# Patient Record
Sex: Male | Born: 1955 | Race: White | Hispanic: No | Marital: Married
Health system: Southern US, Community
[De-identification: ages and names within clinical notes are randomized; demographics above are authoritative.]

---

## 2007-12-25 ENCOUNTER — Ambulatory Visit: Payer: Self-pay | Admitting: Gastroenterology

## 2007-12-25 ENCOUNTER — Other Ambulatory Visit: Payer: Self-pay

## 2008-01-19 ENCOUNTER — Other Ambulatory Visit: Payer: Self-pay

## 2008-01-19 ENCOUNTER — Ambulatory Visit: Payer: Self-pay | Admitting: Unknown Physician Specialty

## 2008-01-20 ENCOUNTER — Inpatient Hospital Stay: Payer: Self-pay | Admitting: Unknown Physician Specialty

## 2013-10-25 ENCOUNTER — Inpatient Hospital Stay: Payer: Self-pay | Admitting: Internal Medicine

## 2013-10-25 LAB — BASIC METABOLIC PANEL
ANION GAP: 10 (ref 7–16)
BUN: 15 mg/dL (ref 7–18)
CALCIUM: 8.5 mg/dL (ref 8.5–10.1)
Chloride: 97 mmol/L — ABNORMAL LOW (ref 98–107)
Co2: 25 mmol/L (ref 21–32)
Creatinine: 1.23 mg/dL (ref 0.60–1.30)
EGFR (African American): >60
Glucose: 99 mg/dL (ref 65–99)
OSMOLALITY: 265 (ref 275–301)
Potassium: 2.5 mmol/L — CL (ref 3.5–5.1)
Sodium: 132 mmol/L — ABNORMAL LOW (ref 136–145)

## 2013-10-25 LAB — HEPATIC FUNCTION PANEL A (ARMC)
ALBUMIN: 3.2 g/dL — AB (ref 3.4–5.0)
Alkaline Phosphatase: 100 U/L
BILIRUBIN TOTAL: 1 mg/dL (ref 0.2–1.0)
Bilirubin, Direct: 0.3 mg/dL — ABNORMAL HIGH (ref 0.00–0.20)
SGOT(AST): 75 U/L — ABNORMAL HIGH (ref 15–37)
SGPT (ALT): 91 U/L — ABNORMAL HIGH (ref 12–78)
Total Protein: 7 g/dL (ref 6.4–8.2)

## 2013-10-25 LAB — DRUG SCREEN, URINE

## 2013-10-25 LAB — POTASSIUM: POTASSIUM: 3.6 mmol/L (ref 3.5–5.1)

## 2013-10-25 LAB — URINALYSIS, COMPLETE
Bacteria: NONE SEEN
Bilirubin,UR: NEGATIVE
Glucose,UR: NEGATIVE mg/dL (ref 0–75)
Ketone: NEGATIVE
LEUKOCYTE ESTERASE: NEGATIVE
Nitrite: NEGATIVE
PROTEIN: NEGATIVE
Ph: 5 (ref 4.5–8.0)
SPECIFIC GRAVITY: 1.014 (ref 1.003–1.030)
SQUAMOUS EPITHELIAL: NONE SEEN
WBC UR: 7 /HPF (ref 0–5)

## 2013-10-25 LAB — PHENYTOIN LEVEL, TOTAL: Dilantin: <0.4 ug/mL — ABNORMAL LOW (ref 10.0–20.0)

## 2013-10-25 LAB — CBC
HCT: 41.3 % (ref 40.0–52.0)
HGB: 14.1 g/dL (ref 13.0–18.0)
MCH: 34.8 pg — ABNORMAL HIGH (ref 26.0–34.0)
MCHC: 34.1 g/dL (ref 32.0–36.0)
MCV: 102 fL — AB (ref 80–100)
PLATELETS: 147 10*3/uL — AB (ref 150–440)
RBC: 4.04 10*6/uL — ABNORMAL LOW (ref 4.40–5.90)
RDW: 15 % — AB (ref 11.5–14.5)
WBC: 13.9 10*3/uL — ABNORMAL HIGH (ref 3.8–10.6)

## 2013-10-25 LAB — CARBAMAZEPINE LEVEL, TOTAL: Carbamazepine: <0.5 ug/mL — ABNORMAL LOW (ref 4.0–12.0)

## 2013-10-25 LAB — PHENOBARBITAL LEVEL

## 2013-10-25 LAB — PHOSPHORUS: PHOSPHORUS: 4 mg/dL (ref 2.5–4.9)

## 2013-10-25 LAB — MAGNESIUM: Magnesium: 2.3 mg/dL

## 2013-10-25 LAB — VALPROIC ACID LEVEL: Valproic Acid: <3 ug/mL — ABNORMAL LOW

## 2013-10-26 LAB — COMPREHENSIVE METABOLIC PANEL
ALK PHOS: 100 U/L
Albumin: 2.7 g/dL — ABNORMAL LOW (ref 3.4–5.0)
Anion Gap: 6 — ABNORMAL LOW (ref 7–16)
BUN: 12 mg/dL (ref 7–18)
Bilirubin,Total: 0.7 mg/dL (ref 0.2–1.0)
CREATININE: 1.1 mg/dL (ref 0.60–1.30)
Calcium, Total: 8.2 mg/dL — ABNORMAL LOW (ref 8.5–10.1)
Chloride: 109 mmol/L — ABNORMAL HIGH (ref 98–107)
Co2: 24 mmol/L (ref 21–32)
EGFR (African American): >60
GLUCOSE: 85 mg/dL (ref 65–99)
Osmolality: 277 (ref 275–301)
Potassium: 3.2 mmol/L — ABNORMAL LOW (ref 3.5–5.1)
SGOT(AST): 58 U/L — ABNORMAL HIGH (ref 15–37)
SGPT (ALT): 64 U/L (ref 12–78)
Sodium: 139 mmol/L (ref 136–145)
Total Protein: 6.2 g/dL — ABNORMAL LOW (ref 6.4–8.2)

## 2013-10-26 LAB — CBC WITH DIFFERENTIAL/PLATELET
BASOS ABS: 0 10*3/uL (ref 0.0–0.1)
Basophil %: 0.4 %
EOS PCT: 0.5 %
Eosinophil #: 0 10*3/uL (ref 0.0–0.7)
HCT: 37.5 % — ABNORMAL LOW (ref 40.0–52.0)
HGB: 12.8 g/dL — AB (ref 13.0–18.0)
Lymphocyte #: 1.8 10*3/uL (ref 1.0–3.6)
Lymphocyte %: 25.6 %
MCH: 35.1 pg — ABNORMAL HIGH (ref 26.0–34.0)
MCHC: 34.1 g/dL (ref 32.0–36.0)
MCV: 103 fL — AB (ref 80–100)
MONO ABS: 1.3 x10 3/mm — AB (ref 0.2–1.0)
Monocyte %: 19.2 %
NEUTROS ABS: 3.7 10*3/uL (ref 1.4–6.5)
Neutrophil %: 54.3 %
PLATELETS: 111 10*3/uL — AB (ref 150–440)
RBC: 3.65 10*6/uL — ABNORMAL LOW (ref 4.40–5.90)
RDW: 15.5 % — AB (ref 11.5–14.5)
WBC: 6.9 10*3/uL (ref 3.8–10.6)

## 2013-11-25 ENCOUNTER — Ambulatory Visit: Payer: Self-pay | Admitting: Orthopedic Surgery

## 2013-11-25 LAB — BASIC METABOLIC PANEL
Anion Gap: 5 — ABNORMAL LOW (ref 7–16)
BUN: 11 mg/dL (ref 7–18)
CO2: 27 mmol/L (ref 21–32)
CREATININE: 0.87 mg/dL (ref 0.60–1.30)
Calcium, Total: 9.4 mg/dL (ref 8.5–10.1)
Chloride: 110 mmol/L — ABNORMAL HIGH (ref 98–107)
EGFR (Non-African Amer.): >60
GLUCOSE: 75 mg/dL (ref 65–99)
Osmolality: 281 (ref 275–301)
Potassium: 3.9 mmol/L (ref 3.5–5.1)
Sodium: 142 mmol/L (ref 136–145)

## 2013-11-25 LAB — APTT: ACTIVATED PTT: 33.6 s (ref 23.6–35.9)

## 2013-11-25 LAB — URINALYSIS, COMPLETE
Bacteria: NONE SEEN
Bilirubin,UR: NEGATIVE
Blood: NEGATIVE
Glucose,UR: NEGATIVE mg/dL (ref 0–75)
Ketone: NEGATIVE
Leukocyte Esterase: NEGATIVE
NITRITE: NEGATIVE
PH: 5 (ref 4.5–8.0)
Protein: NEGATIVE
RBC,UR: 1 /HPF (ref 0–5)
Specific Gravity: 1.02 (ref 1.003–1.030)
Squamous Epithelial: NONE SEEN

## 2013-11-25 LAB — CBC
HCT: 44.8 % (ref 40.0–52.0)
HGB: 15.1 g/dL (ref 13.0–18.0)
MCH: 34.3 pg — ABNORMAL HIGH (ref 26.0–34.0)
MCHC: 33.6 g/dL (ref 32.0–36.0)
MCV: 102 fL — ABNORMAL HIGH (ref 80–100)
Platelet: 187 10*3/uL (ref 150–440)
RBC: 4.4 10*6/uL (ref 4.40–5.90)
RDW: 13.5 % (ref 11.5–14.5)
WBC: 8.6 10*3/uL (ref 3.8–10.6)

## 2013-11-25 LAB — PROTIME-INR
INR: 1.1
Prothrombin Time: 13.6 secs (ref 11.5–14.7)

## 2014-01-20 ENCOUNTER — Ambulatory Visit: Payer: Self-pay | Admitting: Orthopedic Surgery

## 2014-01-20 LAB — DRUG SCREEN, URINE
AMPHETAMINES, UR SCREEN: NEGATIVE (ref ?–1000)
BARBITURATES, UR SCREEN: NEGATIVE (ref ?–200)
BENZODIAZEPINE, UR SCRN: NEGATIVE (ref ?–200)
Cannabinoid 50 Ng, Ur ~~LOC~~: POSITIVE (ref ?–50)
Cocaine Metabolite,Ur ~~LOC~~: NEGATIVE (ref ?–300)
MDMA (ECSTASY) UR SCREEN: NEGATIVE (ref ?–500)
METHADONE, UR SCREEN: NEGATIVE (ref ?–300)
OPIATE, UR SCREEN: POSITIVE (ref ?–300)
Phencyclidine (PCP) Ur S: NEGATIVE (ref ?–25)
TRICYCLIC, UR SCREEN: NEGATIVE (ref ?–1000)

## 2014-10-09 NOTE — Consult Note (Signed)
PATIENT NAME:  John Maldonado, [name] MR#:  [id number] DATE OF BIRTH:  July 28, 1955  DATE OF CONSULTATION:  10/26/2013  CONSULTING PHYSICIAN:  Kathreen DevoidKevin L. Malonie Tatum, MD  REASON FOR CONSULTATION:  1.  Right shoulder full-thickness rotator cuff tear.  2.  Right distal radius fracture.   HISTORY OF PRESENT ILLNESS: John Maldonado is a 59 year old male who has a history of heavy alcohol use. He recently tried to quit cold [place] and sustained new onset seizures. He sustained a fall during the seizure activity injuring his right shoulder. The patient explains to me today that he had a fall approximately two weeks ago and injured his right wrist. He has been self treating with an over-the-counter brace that he bought on his own. However, he has had persistent pain and swelling in the right wrist despite immobilization.   PAST MEDICAL HISTORY, PAST surgical history, family and social history as well as medicationS AND allergies: Reviewed from the patient's electronic medical record and from his admission H and P.   PHYSICAL EXAMINATION: RIGHT SHOULDER. The patient had ecchymosis throughout the right upper arm. He had point tenderness over the right shoulder with swelling in place. He had intact sensation to light touch. There was no associated erythema. He had intact sensation over the axillary nerve distribution and throughout the right upper extremity. He had point tenderness over the distal radius. He can flex and extend all five digits and had intact sensation in all five digits of his fingers, well-perfused and he had a palpable radial pulse. The patient could not actively abduct or forward elevate his arm beyond approximately 20 to 30 degrees and had significant pain with this activity.   RADIOLOGY:  Right Wrist:  X-ray films of the right wrist were reviewed from [facility]. This shows a fracture of the distal radius involving the radial styloid. There is approximately a 1 to 2 mm  step-off but no significant diastasis or radial loss of radial height. The patient has no significant angulation to the fracture on the sagittal views.   Right shoulder. MRI was reviewed today which demonstrated full-thickness rotator cuff tear involving the supra and infraspinatus. There was also partial tear of the subscapularis which has allowed subluxation medially of the biceps tendon out of the intertuberous groove. There is no fracture seen.   ASSESSMENT: 1.  Full-thickness rotator cuff tear.  2.  Right distal radius fracture.   PLAN: I explained to John Maldonado that he has a fracture of his right distal radius. This was likely two weeks old already. He is still, however, having pain and swelling. I recommended placement of a short arm cast which I performed at the bedside today. He will continue wearing this for the next 2 to 3 weeks, and follow up in my office for removal.   In addition the patient has a full-thickness rotator cuff tear which will require surgical intervention. The patient is very active, and works at a job requiring manual labor. He will require full strength and use of his right shoulder.   I will see him back in the office in 2 weeks to allow the swelling and ecchymosis to resolve. He will require surgical intervention. We will discuss surgery at his next office visit and select the date for surgery. He may use the right arm as tolerated at this time.   The patient understood and agreed with this plan.   ____________________________ Kathreen DevoidKevin L. Arden Axon, MD klk:jb D: 10/27/2013 18:23:52 ET T: 10/27/2013 18:36:35  ET JOB#: [id number]  cc: Kathreen Devoid, MD, <Dictator> Kathreen Devoid MD ELECTRONICALLY SIGNED 11/03/2013 7:49

## 2014-10-09 NOTE — Op Note (Signed)
PATIENT NAME:  John, Maldonado MR#:  [id number] DATE OF BIRTH:  October 03, 1955  DATE OF PROCEDURE:  01/20/2014  PREOPERATIVE DIAGNOSES: Right shoulder rotator cuff tear, subacromial impingement, acromioclavicular arthrosis and biceps tendon subluxation.   POSTOPERATIVE DIAGNOSES: Right rotator cuff tear involving the supraspinatus and infraspinatus and a partial tear of the subscapularis, subacromial impingement, acromioclavicular arthrosis and biceps tendon subluxation.   PROCEDURE: Right shoulder arthroscopic subacromial decompression, distal clavicle excision, and mini open biceps tenodesis, subscapularis, supraspinatus and infraspinatus rotator cuff repairs.   SURGEON: Thornton Park, MD  ANESTHESIA: General with interscalene block and local anesthetic with lidocaine and Marcaine.   ESTIMATED BLOOD LOSS: Minimal.   COMPLICATIONS: None.   IMPLANTS: ArthroCare Magnum M anchors x2 and Magnum 2 anchors x6.   INDICATION FOR THE PROCEDURE: The patient is a 59 year old male who sustained a fall in May of 2015. A MRI has confirmed a full-thickness tear of his rotator cuff involving the supra and infraspinatus with retraction to the glenohumeral joint. There was a partial tear to the subscapularis and subluxation of the biceps tendon. The patient is very active and works a Ambulance person job and has had persistent pain and weakness in the right shoulder. He wished to proceed with repair of his rotator cuff.   I reviewed the risks and benefits of surgery with the patient prior to surgery. He understands the risks include infection, bleeding, nerve or blood vessel injury which may lead to permanent numbness or weakness, shoulder stiffness, re-tear of the rotator cuff, hardware failure, persistent pain and the need for further surgery. Medical risks include but are not limited to DVT and pulmonary embolism, myocardial infarction, stroke, pneumonia, respiratory failure and death. The patient understood these  risks and wished to proceed. He signed the consent form.   DESCRIPTION OF PROCEDURE: I met with the patient in the preoperative area. I filled out his history and physical form. I marked the right shoulder with the word "yes" according to the hospital's right site protocol. I answered all of the questions by the patient. He had an interscalene block placed by the anesthesia service. He was then positioned in a beach chair position. All bony prominences were adequately padded and care was taken to align his head in the neutral position in the headholder. A spider arm positioner was used for this case.   The patient was prepped and draped in a sterile fashion. A timeout was performed to verify the patient's name, date of birth, medical record number, correct site of surgery and correct procedure to be performed. It was also used to verify the patient had received antibiotics and that all appropriate instruments, implants and radiographic studies were available in the room. Once all in attendance were in agreement, the case began.   Examination under anesthesia revealed that the patient had some stiffness. He could be forward elevated to approximately 140 degrees and in 90 degrees of abduction he can actually rotate to approximately 75 to 80 degrees and internally rotate approximately 50 degrees. He had no limitation with external/internal rotation with the arm adducted by his side. In abduction he can achieve approximately 100 to 110 degrees. There was no evidence of instability to load and shift testing.   The patient's bony landmarks were drawn out with a surgical marker along with proposed arthroscopy incisions. These were pre-injected with 1% lidocaine plain. An 11 blade was used to establish the posterior portal through which the arthroscope was placed into the glenohumeral joint. A full diagnostic examination of  the shoulder was undertaken. During the diagnostic portion of the procedure, an 18-gauge  spinal needle was used to localize placement for the anterior portal through which a 5.75 mm arthroscopic cannula was placed.   Findings on arthroscopy included subluxation of the biceps tendon medially out of the intertuberous groove and a tear involving the subscapularis, which was not retracted and involved the superior portion of the subscapularis. This was a partial tear. The patient had complete tears of the supraspinatus and infraspinatus, which were retracted to the level of the glenohumeral joint. There were no arthroscopic loose bodies in the inferior recess. The glenoid labrum was significantly frayed, but not detached from the glenoid.   A 4-0 resector shaver blade was placed through the anterior portal. It was used to debride the frayed edges of the labrum. The patient also had chondral flaps on the humeral head and glenoid. Chondroplasty of both surfaces were performed. A lateral portal was then established, again using direct visualization with an 18-gauge spinal needle. An Community education officer stitch was then placed in the biceps tendon through the lateral portal to allow for control of the tendon after tenolysis. An arthroscopic scissor was then placed through the anterior portal and the biceps tendon released. A 4-0 resector shaver blade was then placed through the anterior portal and the remaining biceps stump attached to the superior labrum, was debrided. A Smart stich was then placed in the torn portion of the subscapularis.  Two additional ArthroCare Smart stitches were then brought in through the lateral portal and placed in the lateral edge of the rotator cuff to be used as traction sutures.   The arthroscope was then placed into the subacromial space. A 90 degree ArthroCare wand and 4-0 resector shaver blade were used to perform a bursectomy. A 5.5 mm resector shaver blade was then brought in through the lateral portal to perform a subacromial decompression. The 5.5 mm resector shaver  blade was then placed through the anterior portal into the [facility] joint and a distal clavicle excision was performed.   All arthroscopic instruments were then removed after copious irrigation of the subacromial space.   The mini open portion of the procedure was then initiated by making a saber-type incision off the lateral border of the acromion. Subacromial tissues were dissected carefully with electrocautery. All bleeding vessels were cauterized during the exposure. The anterior raphe of the deltoid muscle was identified. This was bluntly split in line with its fibers and a self-retaining shoulder retractor was placed. The previously placed Smart stitches were brought out through the deltoid split. The greater tuberosity was then carefully debrided using a 5.5 mm resector shaver blade to remove all remaining torn fibers of the rotator cuff. Punctate bleeding of the greater tuberosity insertion point was observed to allow for rotator cuff healing.   A Magnum 2 anchor was then placed at the top of the intertuberous groove. This was used to perform the biceps tenodesis. Once the biceps was in place, a second Magnum 2 anchor was placed in the lesser tuberosity to repair the subscapularis tendon. Once the biceps tenodesis and subscapularis repair were completed, the attention was turned to the supra and infraspinatus tendon repairs.   Two Magnum M anchors were placed at the articular margin between the humeral head and greater tuberosity. A single suture from each of these anchors was then passed medially through the rotator cuff with a First-Pass suture passer and clamped with a hemostat for later repair. Two additional Smart stitches were  placed on the lateral edge of the rotator cuff. A total of 4 Magnum 2 anchors were then placed along the lateral edge of the greater tuberosity. These were loaded with a single Smart stitch. The 4 Magnum 2 anchors were tensioned during placement to allow for excellent  approximation of the rotator cuff to the greater tuberosity insertion site. Once the rotator cuff was reduced and the remaining suture was cut, the Magnum M anchors were then tied down by hand. A couple of #2 Tycron sutures were used to make a side-to-side repair at the anterior most border of the supraspinatus. The wound was then copiously irrigated. Final images of the rotator cuff repair were taken both externally and arthroscopically from the glenohumeral side. All arthroscopic instruments were then removed. The deltoid fascia was closed with 0 interrupted suture, the subcutaneous tissue closed with 2-0 Vicryl, and the saber incision skin was closed with a running 4-0 Monocryl. The 3 arthroscopy portals were closed with 4-0 nylon. A dry sterile dressing was applied along with TENS unit pads, a Polar Care sleeve and an abduction sling. The patient was awakened and brought to the PACU in stable condition. I was scrubbed and present for the entire case, and all sharp and instrument counts were correct at the conclusion of the case.  ____________________________ Timoteo Gaul, MD klk:sb D: 01/25/2014 08:22:38 ET T: 01/25/2014 08:44:31 ET JOB#: [id number]  cc: Timoteo Gaul, MD, <Dictator> Timoteo Gaul MD ELECTRONICALLY SIGNED 01/25/2014 18:20

## 2014-10-09 NOTE — Consult Note (Signed)
Brief Consult Note: Diagnosis: Right shoulder full thickness rotator cuff tear and right distal radius fracture.   Patient was seen by consultant.   Consult note dictated.   Recommend to proceed with surgery or procedure.   Discussed with Attending MD.   Comments: Discussed with Dr. Nemiah CommanderKalisetti.  Reviewed right wrist films and right shoulder MRI.  Patient has sustained a minimally displaced distal radius fracture and a large full thickness tear of the right rotator cuff.  His right wrist was placed in a short arm cast today at the bedside.  The rotator cuff will require surgery as an outpatient.  Patient should follow up with me in the office in 2 weeks.  Electronic Signatures: Juanell FairlyKrasinski, Zeanna Sunde (MD)  (Signed 11-May-15 16:16)  Authored: Brief Consult Note   Last Updated: 11-May-15 16:16 by Juanell FairlyKrasinski, Mayling Aber (MD)

## 2014-10-09 NOTE — Consult Note (Signed)
Brief Consult Note: Diagnosis: Right shoulder pain status post seizure with fall onto right side.   Comments: Aware of consult.  I have reviewed the radiographs of the right shoulder.  There is no evidence for fracture or dislocation or significant degenerative disease.  MRI has been ordered.  Awaiting results to make final recommendations.  Electronic Signatures: Juanell FairlyKrasinski, Kherington Meraz (MD)  (Signed 10-May-15 17:59)  Authored: Brief Consult Note   Last Updated: 10-May-15 17:59 by Juanell FairlyKrasinski, Natascha Edmonds (MD)

## 2014-10-09 NOTE — H&P (Signed)
PATIENT NAME:  John Maldonado, John Maldonado MR#:  [id number] DATE OF BIRTH:  02/26/1956  DATE OF ADMISSION:  10/25/2013  ADMITTING PHYSICIAN: Enid Baas, M.D.   PRIMARY CARE PHYSICIAN: None.   CHIEF COMPLAINT: Seizure.   HISTORY OF PRESENT ILLNESS: Mr. Awbrey is a 59 year old Caucasian male with no significant past medical history other than heavy alcohol abuse, presents to the hospital after two episodes of generalized tonic-clonic seizures. The patient is completely alert,  not postictal and able to provide most of the history. He states he drinks about almost 24 bottles of wine every day and has been drinking since he was age 72. He never tried to quit alcohol before, but decided to stop drinking and as per his grandfather's advise he decided to do it cold [place]. He stopped drinking about five days ago. He has been having extensive shakes and tremors at home for the last four days that have been improving; however, last night he felt dizzy and then had seizure episodes. He came to his senses immediately. He was completely oriented and thought that was probably from not drinking away; however,  he had another seizure in front of his family last night so his family brought him to the ER. The patient did bite his tongue on the right edge of the tongue, was incontinent during the episode, and it was a witnessed generalized tonic-clonic seizure. Again, he is not taking any antiepileptics and never had seizure history before.   The patient also complains of severe right shoulder pain, and has limited mobility of the right shoulder since he fell during the seizure onto his right side.    PAST MEDICAL HISTORY: Alcohol abuse.    PAST SURGICAL HISTORY: His right leg toes, three of the toes have been cut off after a mechanical trauma.   ALLERGIES TO MEDICATIONS: FISH.   Current home medications: Does not take any prescription medication. He has been using over the counter Benadryl as needed over the last  four days.   SOCIAL HISTORY: Lives at home by himself. He is very active around the house.  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 Denies any smoking but does drink about 24 bottles of wine at least every night and had stopped drinking for five days now.  FAMILY HISTORY: Mom has congenital muscular dystrophy and dad was an alcoholic and passed away from heart attack.   REVIEW OF SYSTEMS:  CONSTITUTIONAL: Positive for chills. No fevers, no fatigue or weakness.  HEENT: Eyes: Uses glasses, no double vision, glaucoma or cataracts. No inflammation. ENT: No tinnitus, ear pain, hearing loss, epistaxis or discharge.  RESPIRATORY: No cough, wheeze, hemoptysis or chronic obstructive pulmonary disease.  CARDIOVASCULAR: No chest pain, orthopnea, edema, arrhythmia, palpitations or syncope.  GASTROINTESTINAL: No nausea or vomiting. Positive for mild diarrhea since stopping of alcohol. No abdominal pain. No hematemesis or melena.  GENITOURINARY: No dysuria, hematuria, renal calculus, frequency, or incontinence.  ENDOCRINE: No polyuria, nocturia, thyroid problems, heat or cold intolerance.  HEMATOLOGY: No anemia, easy bruising or bleeding.  SKIN: No acne, rash or lesions.  MUSCULOSKELETAL: No neck, back pain.  Positive for right shoulder pain and inability to move.  NEUROLOGICAL:  No numbness, weakness, CVA, TIA or seizures.  PSYCHOLOGICAL: No anxiety, insomnia or depression.   PHYSICAL EXAMINATION:  VITAL SIGNS: Temperature 98.7 degrees, pulse 87, respirations 20, blood pressure 161/92, pulse oximetry 98% on room air.  GENERAL: Well-built, well-nourished male lying in bed, not in any acute distress.  HEENT: Normocephalic, atraumatic. Pupils equal, round, reacting to light. Anicteric sclerae. Extraocular movements intact. Oropharynx clear without erythema, mass or exudates.  NECK: Supple. No thyromegaly, JVD or carotid bruits, no lymphadenopathy.  LUNGS: Moving air bilaterally.  No wheeze or crackles. No use of accessory muscles for breathing.  CARDIOVASCULAR: S1, S2, regular rate and rhythm. No murmurs, rubs, or gallops.  ABDOMEN: Soft, nontender, nondistended. No hepatosplenomegaly. Normal bowel sounds.  EXTREMITIES: No pedal edema. No clubbing or cyanosis. Has 2+ dorsalis pedis pulses palpable bilaterally. Right shoulder is adducted and internally rotated and complains of pain with minimal movement at this time. Also has wrist soft splint to the right hand from an old ligament injury.   NEUROLOGIC: Cranial nerves intact. No focal, motor or sensory deficits.  LYMPHATIC:  No cervical or inguinal lymphadenopathy.  PSYCHOLOGICAL: The patient is awake, alert, oriented x 3 at this time.   LABORATORY DATA: WBC is 13.9, hemoglobin 14.1, hematocrit 41.3, platelet count 147.   Sodium 132, potassium 2.5, chloride 97, bicarbonate 25, BUN 15, creatinine 1.2, glucose 99 and calcium of 8.5. Urine tox screen positive for opiates and marijuana. Urinalysis negative for any infection.   Total bilirubin is 1.0, alkaline phosphatase 100. ALT 91, AST 75, albumin 3.2. CT of the head without contrast showing no acute intracranial findings but moderate atrophy. Cerebral atrophy noted. EKG showing normal sinus rhythm, incomplete right bundle branch block. No acute ST-T wave changes.   ASSESSMENT AND PLAN: This is a 59 year old male with no significant past medical history other than alcohol abuse. Admitted for seizures after stopping alcohol.  1. Seizures likely withdrawal seizures. No prior history of seizure disorder. CT of the head is negative. We will hold off on starting any antiepileptics at this time. Continue CIWA protocol. He is not postictal. He is alert, oriented at this time. Ativan p.r.n.  2. Alcohol use and withdrawals now quit for five days. Continued CIWA protocol has been canceled. 3. Right shoulder pain since the seizure and fall. Limited mobility at this time. We will get an  MRI and possible rotator cuff tear as x-ray did not show any bony abnormality or fractures. Pain medications as needed.  4. Elevated liver function tests. 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 f/u. 5. Hypokalemia- being replaced and recheck tomorrow  CODE STATUS: Full code.   TIME SPENT ON ADMISSION: 50 minutes.    ____________________________ Enid Baasadhika Eather Chaires, MD rk:sg D: 10/25/2013 13:49:44 ET T: 10/25/2013 14:17:37 ET JOB#: [id number]  cc: Enid Baasadhika Artavia Jeanlouis, MD, <Dictator> Enid BaasADHIKA Marycruz Boehner MD ELECTRONICALLY SIGNED 11/12/2013 12:19

## 2014-10-09 NOTE — Discharge Summary (Signed)
PATIENT NAME:  John Maldonado, John Maldonado MR#:  [id number] DATE OF BIRTH:  [birth year]  DATE OF ADMISSION:  10/25/2013 DATE OF DISCHARGE:  10/27/2013  PRIMARY CARE PHYSICIAN: None.  CONSULTATIONS IN THE HOSPITAL:  Orthopedic consultation by Dr. Mack Guise.   DISCHARGE DIAGNOSES: 1.  Alcohol withdrawal seizures.  2.  Alcohol abuse.  3.  Tobacco use disorder.  4.  Right shoulder full thickness rotator cuff tear.  5.  Right wrist fracture from fall. 6.  Alcoholic liver disease.  DISCHARGE HOME MEDICATIONS:  Percocet 5/325 mg 1 to 2 tablets every 4 hours as needed for pain.   DISCHARGE DIET: Regular diet.   DISCHARGE ACTIVITY: As tolerated.    FOLLOWUP INSTRUCTIONS:  Follow up with orthopedics in 1 to 2 weeks.  LABORATORIES AND IMAGING STUDIES PRIOR TO DISCHARGE:  WBC 6.9, hemoglobin 12.8, hematocrit 37.5, platelet count is 111. Sodium 139, potassium 3.2, chloride 109, bicarb 24, BUN 12, creatinine 1.2, glucose 85 and calcium of 8.2.  ALT 64, AST 58, alk phos100, albumin of 3.7, total bilirubin 0.7.   Urinalysis negative for any infection. Urine tox screen positive for marijuana, opiates.  CT of the head showing moderate atrophy. No evidence of any acute intracranial findings.  Right wrist x-ray showing comminuted intra-articular fracture of distal radius and nondisplaced fracture of ulnar styloid.   MRI of the right shoulder showing large full-thickness insertional tear of the rotator cuff involving supraspinatus and infraspinatus tendons, edema throughout the musculature suggesting acute injury. No underlying muscular atrophy is seen and partial tear of subscapularis tendon with resulting medial dislocation of the biceps tendon from the bicipital groove seen. Soft tissue edema throughout subcutaneous fat in deltoid muscle noted.  BRIEF HOSPITAL COURSE: John Maldonado is a 59 year old Caucasian male with no significant past medical history other than tobacco use and alcohol abuse, presents to the  hospital after seizures at home.  1.  Seizures secondary to alcohol withdrawal. The patient never stopped alcohol but decided to stop drinking alcohol for the last 5 days. Prior to admission, he had extensive tremors at home followed by generalized tonic-clonic seizure twice for which he was brought to the hospital. Since it was withdrawal seizures, no prior seizure disorder, CT of the head is negative, he is not started on any antiepileptics. He was on CIWA protocol, was given Ativan p.r.n. He has not had any further seizures while in the hospital. 2.  Fall and right rotator cuff with extensive limitation of motion of right shoulder seen by orthopedics, Dr. Mack Guise and on pain medications at this time and will need outpatient orthoscopic surgery which will be scheduled in the next 1 to 2 weeks. The patient also suffered right wrist fracture for which he is placed in a cast. 3.  Alcoholic liver disease. He is counseled against alcohol abuse at this time and the patient is motivated to quit alcohol. 4.  His course has been otherwise uneventful in the hospital.   DISCHARGE CONDITION: Stable.   DISCHARGE DISPOSITION: Home.  TIME SPENT ON DISCHARGE:  40 minutes.   ____________________________ Gladstone Lighter, MD rk:ce D: 10/27/2013 15:22:37 ET T: 10/27/2013 17:51:43 ET JOB#: [id number]  cc: Gladstone Lighter, MD, <Dictator> Timoteo Gaul, MD Gladstone Lighter MD ELECTRONICALLY SIGNED 11/12/2013 12:21

## 2015-09-20 IMAGING — CR ORBITS FOR FOREIGN BODY - 2 VIEW
1 series · 2 of 2 positions shown · non-contrast
Comparison: None.

CLINICAL DATA: Metal working/exposure; clearance prior to MRI

EXAM:
ORBITS FOR FOREIGN BODY - 2 VIEW

[Series 4: w waters pa · 0.14mm/px · 2 of 2 slices shown]
[im 1/2]
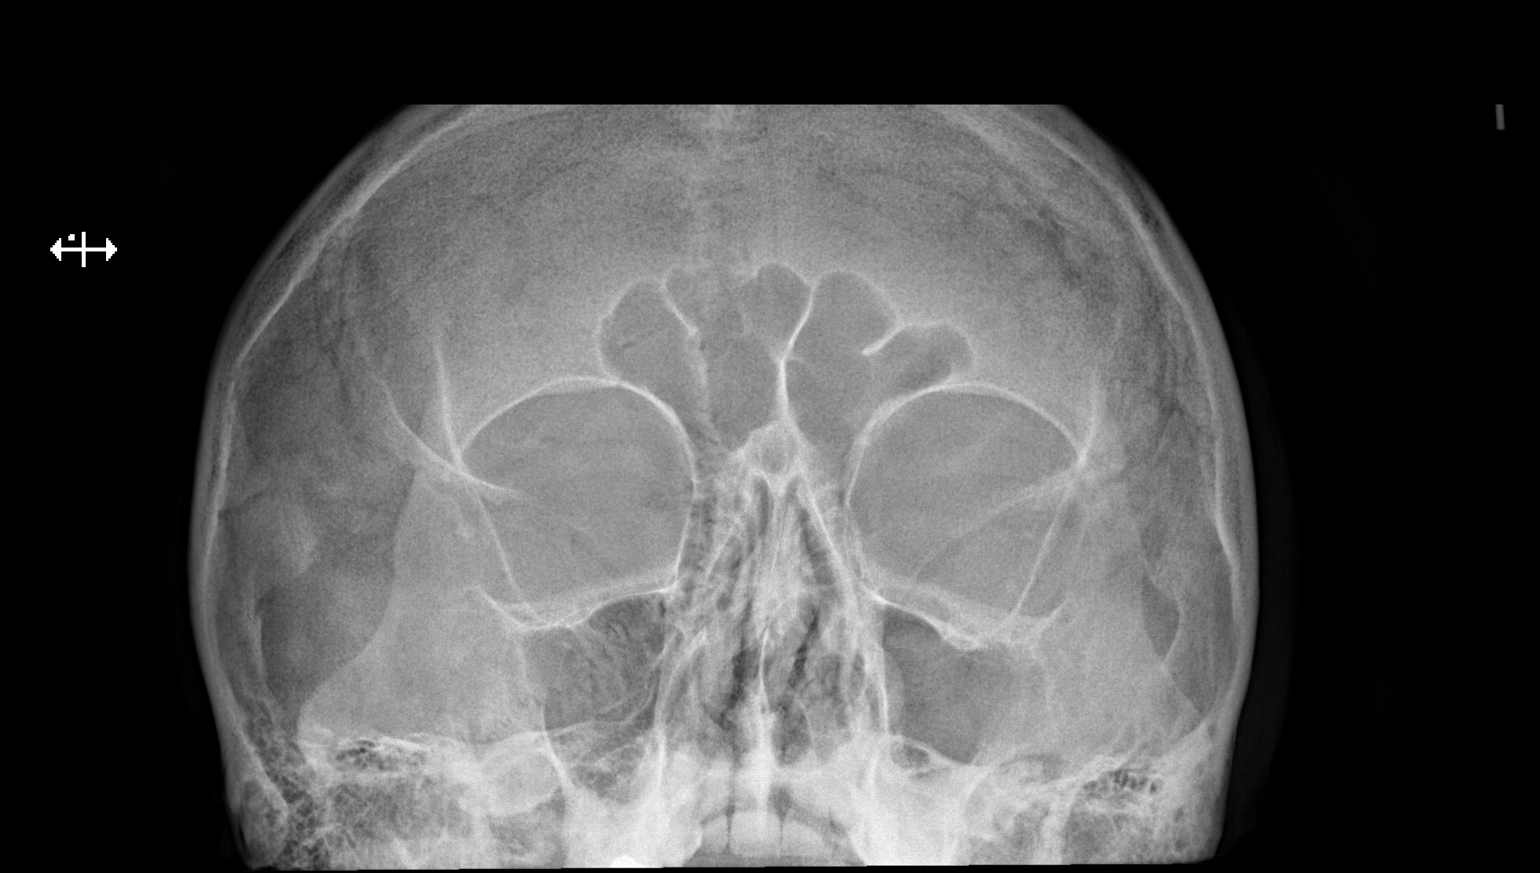
[im 2/2]
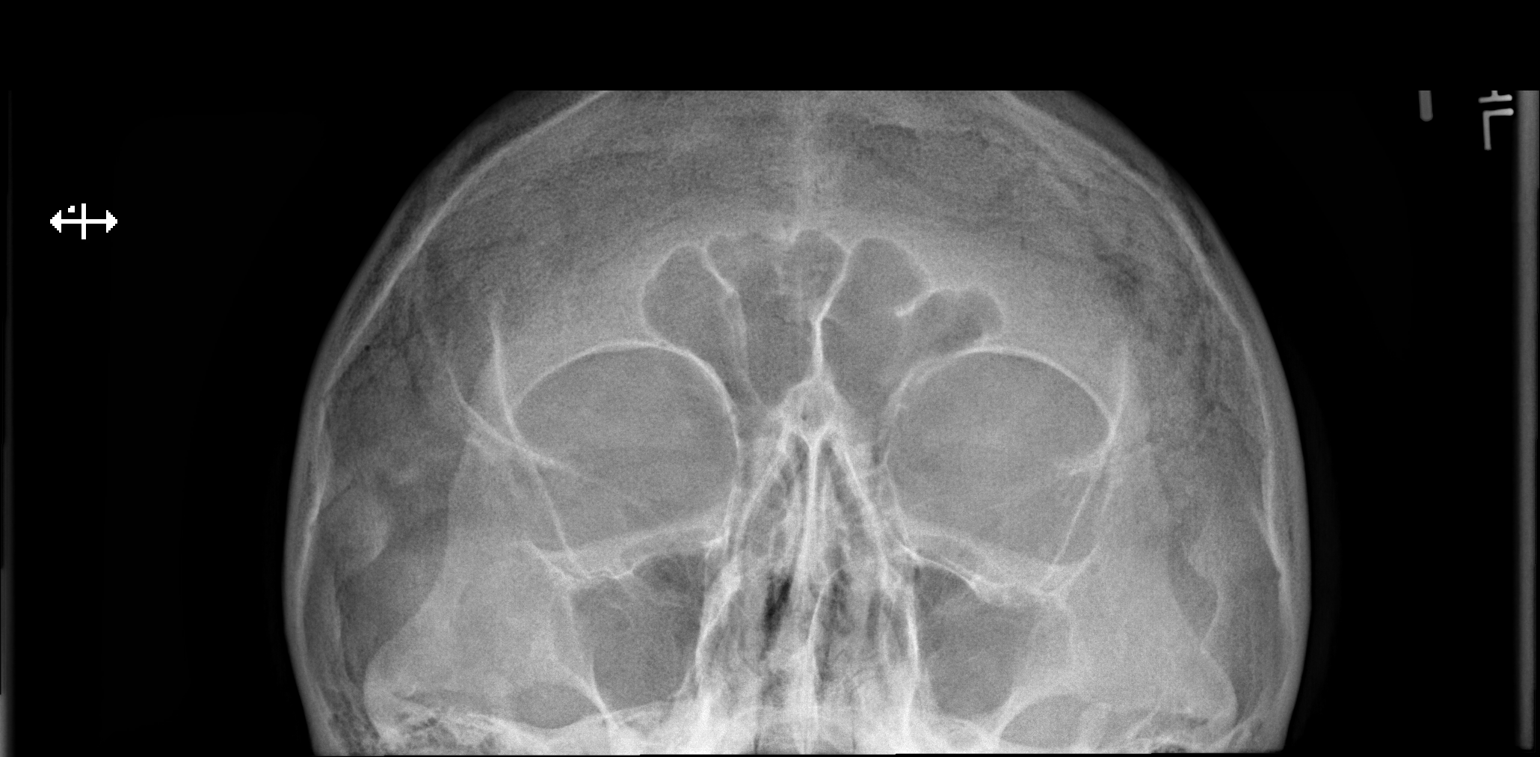

[2 of 2 positions shown; findings below may reference images not displayed]

FINDINGS: There is no evidence of metallic foreign body within the orbits. No
significant bone abnormality identified.
IMPRESSION: No evidence of metallic foreign body within the orbits.

## 2015-09-20 IMAGING — CR DG WRIST COMPLETE 3+V*R*
1 series · 4 of 4 positions shown · non-contrast
Comparison: None.

CLINICAL DATA: Pain and swelling

EXAM:
RIGHT WRIST - COMPLETE 3+ VIEW

[Series 4: x wrist obl right · 0.14mm/px · 4 of 4 slices shown]
[im 1/4]
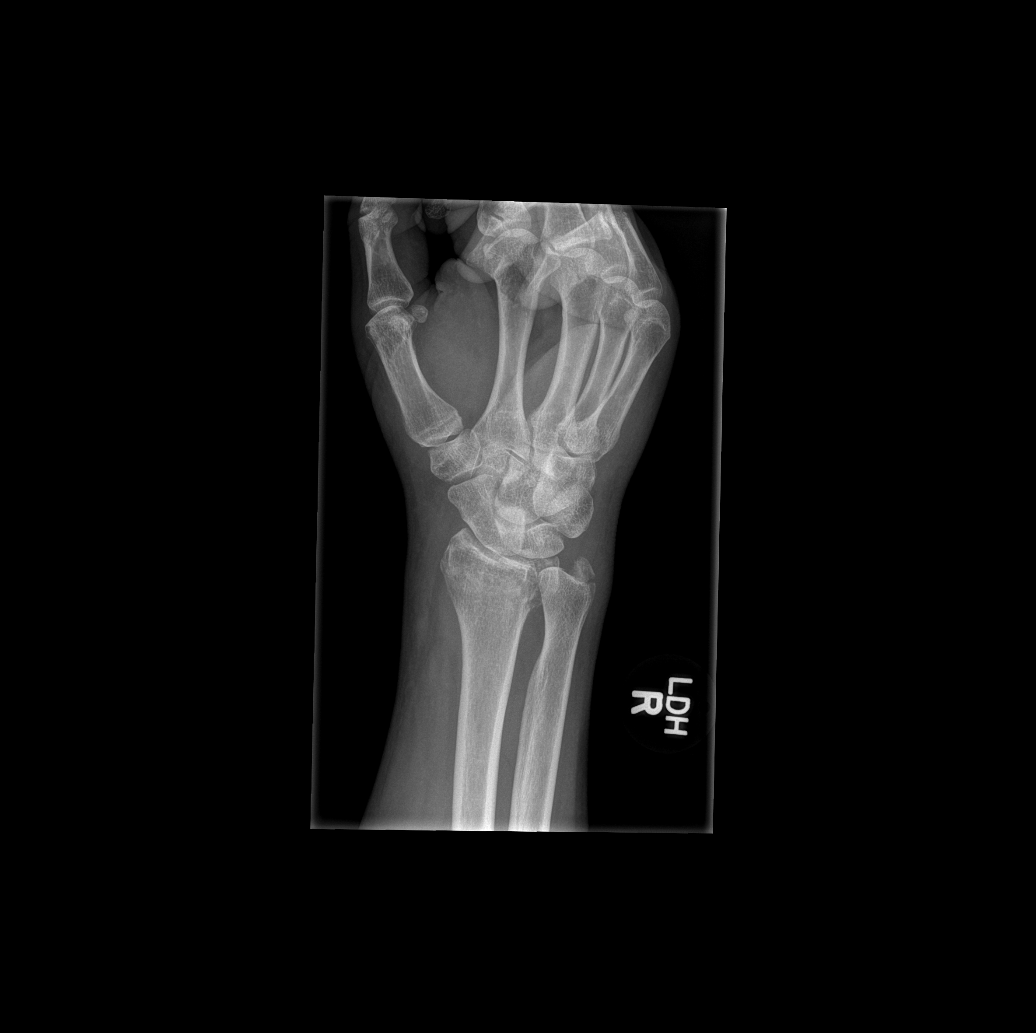
[im 2/4]
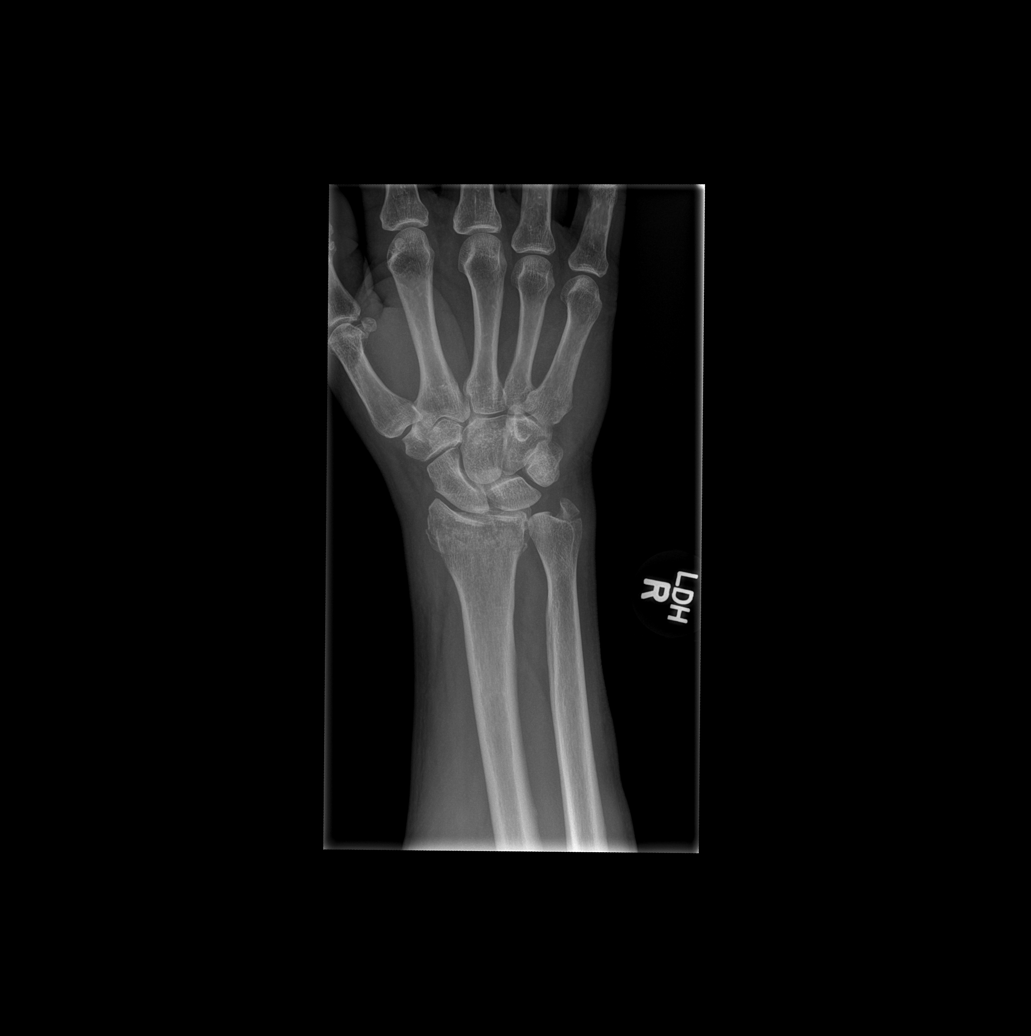
[im 3/4]
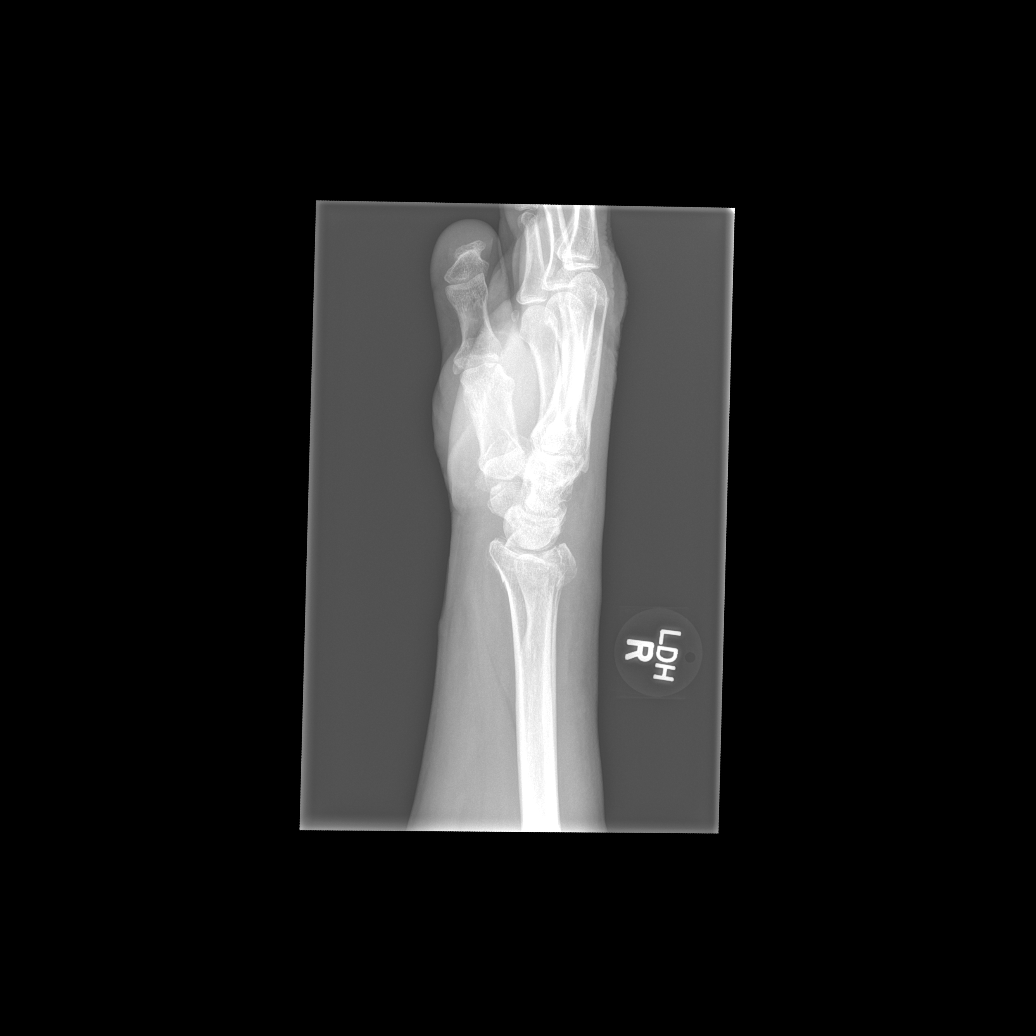
[im 4/4]
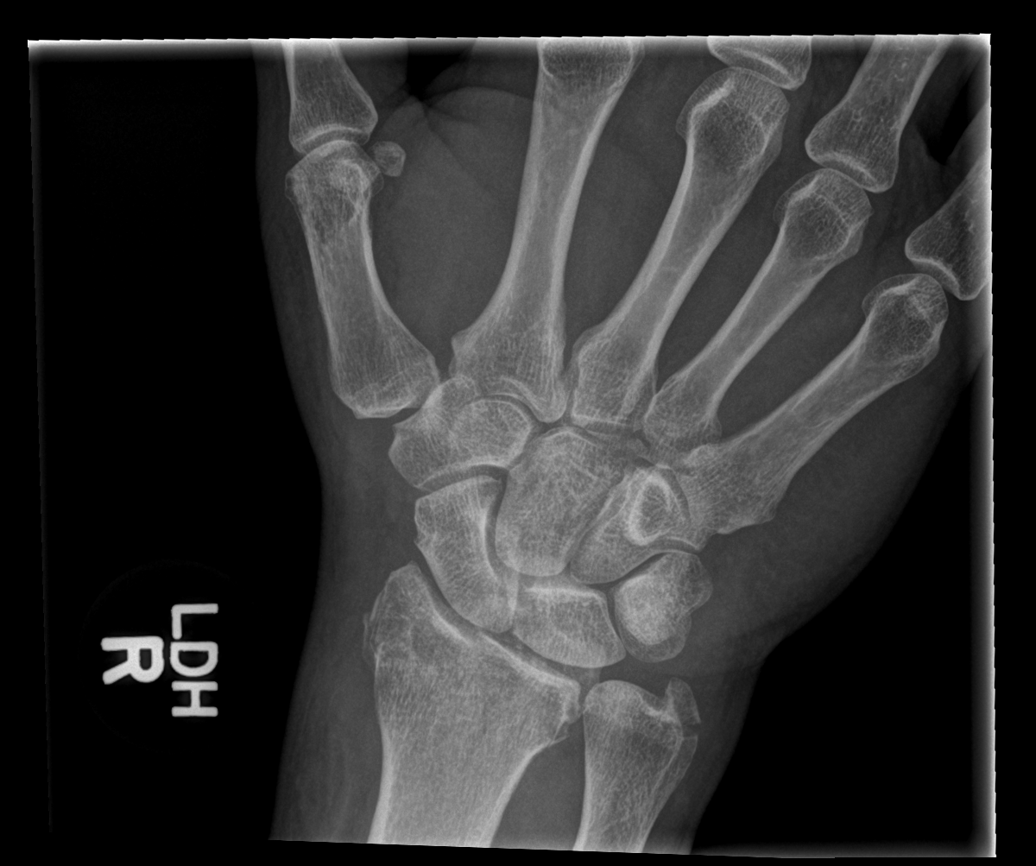

[4 of 4 positions shown; findings below may reference images not displayed]

FINDINGS: Comminuted fracture distal radius extending into the dorsal wrist
joint. Mildly displaced fracture the ulnar styloid. Carpal bones are
normal.
IMPRESSION: Comminuted, intra-articular fracture distal radius. Nondisplaced
fracture of the ulnar styloid.
